# Patient Record
Sex: Male | Born: 2000 | Race: White | Hispanic: No | Smoking: Never smoker
Health system: Southern US, Community
[De-identification: ages and names within clinical notes are randomized; demographics above are authoritative.]

## PROBLEM LIST (undated history)

## (undated) DIAGNOSIS — T7840XA Allergy, unspecified, initial encounter: Secondary | ICD-10-CM

## (undated) DIAGNOSIS — E063 Autoimmune thyroiditis: Secondary | ICD-10-CM

## (undated) DIAGNOSIS — Z8639 Personal history of other endocrine, nutritional and metabolic disease: Secondary | ICD-10-CM

## (undated) DIAGNOSIS — L309 Dermatitis, unspecified: Secondary | ICD-10-CM

## (undated) DIAGNOSIS — Z87898 Personal history of other specified conditions: Secondary | ICD-10-CM

## (undated) HISTORY — PX: MULTIPLE TOOTH EXTRACTIONS: SHX2053

## (undated) HISTORY — DX: Autoimmune thyroiditis: E06.3

## (undated) HISTORY — DX: Allergy, unspecified, initial encounter: T78.40XA

---
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
## 2024-02-25 ENCOUNTER — Other Ambulatory Visit: Payer: Self-pay | Admitting: Nurse Practitioner

## 2024-02-25 ENCOUNTER — Ambulatory Visit
Admission: RE | Admit: 2024-02-25 | Discharge: 2024-02-25 | Disposition: A | Discharge status: Home / Self Care | Source: Ambulatory Visit | Attending: Nurse Practitioner | Admitting: Nurse Practitioner

## 2024-02-25 DIAGNOSIS — Z021 Encounter for pre-employment examination: Secondary | ICD-10-CM

## 2024-10-04 ENCOUNTER — Encounter (HOSPITAL_BASED_OUTPATIENT_CLINIC_OR_DEPARTMENT_OTHER): Payer: Self-pay

## 2024-10-04 ENCOUNTER — Emergency Department (HOSPITAL_BASED_OUTPATIENT_CLINIC_OR_DEPARTMENT_OTHER)
Admission: EM | Admit: 2024-10-04 | Discharge: 2024-10-05 | Disposition: A | Attending: Emergency Medicine | Admitting: Emergency Medicine

## 2024-10-04 ENCOUNTER — Emergency Department (HOSPITAL_BASED_OUTPATIENT_CLINIC_OR_DEPARTMENT_OTHER)

## 2024-10-04 ENCOUNTER — Other Ambulatory Visit: Payer: Self-pay

## 2024-10-04 DIAGNOSIS — L03116 Cellulitis of left lower limb: Secondary | ICD-10-CM | POA: Insufficient documentation

## 2024-10-04 DIAGNOSIS — M7989 Other specified soft tissue disorders: Secondary | ICD-10-CM | POA: Diagnosis present

## 2024-10-04 LAB — CBC WITH DIFFERENTIAL/PLATELET
Abs Immature Granulocytes: 0.03 K/uL (ref 0.00–0.07)
Basophils Absolute: 0 K/uL (ref 0.0–0.1)
Basophils Relative: 0 %
Eosinophils Absolute: 0.3 K/uL (ref 0.0–0.5)
Eosinophils Relative: 3 %
HCT: 40.9 % (ref 39.0–52.0)
Hemoglobin: 13.6 g/dL (ref 13.0–17.0)
Immature Granulocytes: 0 %
Lymphocytes Relative: 16 %
Lymphs Abs: 1.5 K/uL (ref 0.7–4.0)
MCH: 30.3 pg (ref 26.0–34.0)
MCHC: 33.3 g/dL (ref 30.0–36.0)
MCV: 91.1 fL (ref 80.0–100.0)
Monocytes Absolute: 0.5 K/uL (ref 0.1–1.0)
Monocytes Relative: 6 %
Neutro Abs: 6.8 K/uL (ref 1.7–7.7)
Neutrophils Relative %: 75 %
Platelets: 242 K/uL (ref 150–400)
RBC: 4.49 MIL/uL (ref 4.22–5.81)
RDW: 12.5 % (ref 11.5–15.5)
WBC: 9.1 K/uL (ref 4.0–10.5)
nRBC: 0 % (ref 0.0–0.2)

## 2024-10-04 LAB — COMPREHENSIVE METABOLIC PANEL WITH GFR
ALT: 38 U/L (ref 0–44)
AST: 23 U/L (ref 15–41)
Albumin: 4.5 g/dL (ref 3.5–5.0)
Alkaline Phosphatase: 68 U/L (ref 38–126)
Anion gap: 12 (ref 5–15)
BUN: 14 mg/dL (ref 6–20)
CO2: 24 mmol/L (ref 22–32)
Calcium: 9.8 mg/dL (ref 8.9–10.3)
Chloride: 100 mmol/L (ref 98–111)
Creatinine, Ser: 1.21 mg/dL (ref 0.61–1.24)
GFR, Estimated: >60 mL/min
Glucose, Bld: 119 mg/dL — ABNORMAL HIGH (ref 70–99)
Potassium: 3.9 mmol/L (ref 3.5–5.1)
Sodium: 136 mmol/L (ref 135–145)
Total Bilirubin: 0.4 mg/dL (ref 0.0–1.2)
Total Protein: 7.9 g/dL (ref 6.5–8.1)

## 2024-10-04 MED ORDER — DOXYCYCLINE HYCLATE 100 MG PO CAPS: 100.0000 mg | ORAL_CAPSULE | Freq: Two times a day (BID) | ORAL | 0 refills | Status: DC

## 2024-10-04 MED ORDER — CEFTRIAXONE SODIUM 1 G IJ SOLR
1.0000 g | Freq: Once | INTRAMUSCULAR | Status: AC
  Administered 2024-10-05: 1 g via INTRAMUSCULAR
  Filled 2024-10-04: qty 10

## 2024-10-04 MED ORDER — HYDROCODONE-ACETAMINOPHEN 5-325 MG PO TABS
1.0000 | ORAL_TABLET | Freq: Once | ORAL | Status: AC
  Administered 2024-10-04: 1 via ORAL
  Filled 2024-10-04: qty 1

## 2024-10-04 MED ORDER — IBUPROFEN 400 MG PO TABS
600.0000 mg | ORAL_TABLET | Freq: Once | ORAL | Status: AC
  Administered 2024-10-04: 600 mg via ORAL
  Filled 2024-10-04: qty 1

## 2024-10-04 NOTE — ED Triage Notes (Addendum)
 Saw [facility] yesterday dx with [facility]ulitis of [facility] today Knee has become more painful +swelling and redness
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
## 2024-10-06 ENCOUNTER — Inpatient Hospital Stay (HOSPITAL_COMMUNITY)
Admission: EM | Admit: 2024-10-06 | Discharge: 2024-10-09 | DRG: 603 | Disposition: A | Discharge status: Home / Self Care | Attending: Internal Medicine | Admitting: Internal Medicine

## 2024-10-06 ENCOUNTER — Emergency Department (HOSPITAL_COMMUNITY)

## 2024-10-06 ENCOUNTER — Other Ambulatory Visit: Payer: Self-pay

## 2024-10-06 ENCOUNTER — Encounter (HOSPITAL_COMMUNITY): Payer: Self-pay

## 2024-10-06 DIAGNOSIS — Z792 Long term (current) use of antibiotics: Secondary | ICD-10-CM

## 2024-10-06 DIAGNOSIS — L03116 Cellulitis of left lower limb: Secondary | ICD-10-CM | POA: Diagnosis not present

## 2024-10-06 DIAGNOSIS — Z881 Allergy status to other antibiotic agents status: Secondary | ICD-10-CM

## 2024-10-06 DIAGNOSIS — E063 Autoimmune thyroiditis: Secondary | ICD-10-CM | POA: Diagnosis present

## 2024-10-06 DIAGNOSIS — L0390 Cellulitis, unspecified: Secondary | ICD-10-CM | POA: Diagnosis present

## 2024-10-06 DIAGNOSIS — F909 Attention-deficit hyperactivity disorder, unspecified type: Secondary | ICD-10-CM | POA: Insufficient documentation

## 2024-10-06 DIAGNOSIS — M25462 Effusion, left knee: Secondary | ICD-10-CM | POA: Diagnosis present

## 2024-10-06 LAB — CBC WITH DIFFERENTIAL/PLATELET
Abs Immature Granulocytes: 0.03 K/uL (ref 0.00–0.07)
Basophils Absolute: 0 K/uL (ref 0.0–0.1)
Basophils Relative: 0 %
Eosinophils Absolute: 0.3 K/uL (ref 0.0–0.5)
Eosinophils Relative: 5 %
HCT: 39.7 % (ref 39.0–52.0)
Hemoglobin: 13.2 g/dL (ref 13.0–17.0)
Immature Granulocytes: 0 %
Lymphocytes Relative: 28 %
Lymphs Abs: 2 K/uL (ref 0.7–4.0)
MCH: 31.2 pg (ref 26.0–34.0)
MCHC: 33.2 g/dL (ref 30.0–36.0)
MCV: 93.9 fL (ref 80.0–100.0)
Monocytes Absolute: 0.5 K/uL (ref 0.1–1.0)
Monocytes Relative: 7 %
Neutro Abs: 4.2 K/uL (ref 1.7–7.7)
Neutrophils Relative %: 60 %
Platelets: 280 K/uL (ref 150–400)
RBC: 4.23 MIL/uL (ref 4.22–5.81)
RDW: 12.3 % (ref 11.5–15.5)
WBC: 7.1 K/uL (ref 4.0–10.5)
nRBC: 0 % (ref 0.0–0.2)

## 2024-10-06 LAB — COMPREHENSIVE METABOLIC PANEL WITH GFR
ALT: 27 U/L (ref 0–44)
AST: 22 U/L (ref 15–41)
Albumin: 4.2 g/dL (ref 3.5–5.0)
Alkaline Phosphatase: 64 U/L (ref 38–126)
Anion gap: 10 (ref 5–15)
BUN: 12 mg/dL (ref 6–20)
CO2: 27 mmol/L (ref 22–32)
Calcium: 9.5 mg/dL (ref 8.9–10.3)
Chloride: 104 mmol/L (ref 98–111)
Creatinine, Ser: 0.92 mg/dL (ref 0.61–1.24)
GFR, Estimated: >60 mL/min
Glucose, Bld: 110 mg/dL — ABNORMAL HIGH (ref 70–99)
Potassium: 4 mmol/L (ref 3.5–5.1)
Sodium: 140 mmol/L (ref 135–145)
Total Bilirubin: 0.3 mg/dL (ref 0.0–1.2)
Total Protein: 7.6 g/dL (ref 6.5–8.1)

## 2024-10-06 LAB — SEDIMENTATION RATE: Sed Rate: 27 mm/h — ABNORMAL HIGH (ref 0–16)

## 2024-10-06 LAB — I-STAT CG4 LACTIC ACID, ED: Lactic Acid, Venous: 0.5 mmol/L (ref 0.5–1.9)

## 2024-10-06 MED ORDER — SODIUM CHLORIDE 0.9 % IV SOLN
2.0000 g | Freq: Three times a day (TID) | INTRAVENOUS | Status: DC
  Administered 2024-10-07 – 2024-10-09 (×7): 2 g via INTRAVENOUS
  Filled 2024-10-06 (×7): qty 12.5

## 2024-10-06 MED ORDER — OXYCODONE-ACETAMINOPHEN 5-325 MG PO TABS
1.0000 | ORAL_TABLET | Freq: Once | ORAL | Status: AC
  Administered 2024-10-06: 1 via ORAL
  Filled 2024-10-06: qty 1

## 2024-10-06 MED ORDER — VANCOMYCIN HCL 1500 MG/300ML IV SOLN
1500.0000 mg | Freq: Three times a day (TID) | INTRAVENOUS | Status: DC
  Administered 2024-10-07: 1500 mg via INTRAVENOUS
  Filled 2024-10-06 (×2): qty 300

## 2024-10-06 MED ORDER — VANCOMYCIN HCL 2000 MG/400ML IV SOLN: 2000.0000 mg | Freq: Two times a day (BID) | INTRAVENOUS | Status: DC

## 2024-10-06 MED ORDER — VANCOMYCIN HCL 1750 MG/350ML IV SOLN
1750.0000 mg | Freq: Once | INTRAVENOUS | Status: AC
  Administered 2024-10-07: 1750 mg via INTRAVENOUS
  Filled 2024-10-06: qty 350

## 2024-10-06 MED ORDER — VANCOMYCIN HCL IN DEXTROSE 1-5 GM/200ML-% IV SOLN: 1000.0000 mg | Freq: Once | INTRAVENOUS | Status: DC

## 2024-10-06 MED ORDER — KETOROLAC TROMETHAMINE 30 MG/ML IJ SOLN
15.0000 mg | Freq: Once | INTRAMUSCULAR | Status: AC
  Administered 2024-10-06: 15 mg via INTRAVENOUS
  Filled 2024-10-06: qty 1

## 2024-10-06 MED ORDER — SODIUM CHLORIDE 0.9 % IV SOLN
2.0000 g | Freq: Once | INTRAVENOUS | Status: AC
  Administered 2024-10-06: 2 g via INTRAVENOUS
  Filled 2024-10-06: qty 12.5

## 2024-10-06 NOTE — Assessment & Plan Note (Signed)
-  admit per  cellulitis protocol will      Cefepime   vancomycin  given purulent discharge,     plain films showed:   no evidence of air  no evidence of osteomyelitis   no               foreign   objects     Will obtain MRSA screening,       obtain blood cultures  if febrile or septic     further antibiotic adjustment pending above results

## 2024-10-06 NOTE — ED Provider Notes (Signed)
 " [place] EMERGENCY DEPARTMENT AT [facility] Provider Note   CSN: [id number] Arrival date & time: 10/06/24  2035     Patient presents with: Knee Pain   Kenneth Christian is a 24 y.o. male.   Patient to ED with progressively worsening pain, redness and swelling to the left knee that started several days ago. Seen in the ED 10/05/24, given IV Rocephin  and has been taking Doxycycline  and Keflex in the outpatient setting. He went to orthopedic walk-in clinic today and was referred back to the ED with recommendation for inpatient IV abx. No fever. Patient is otherwise healthy and immunocompetent. 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   The history is provided by the patient. No language interpreter was used.  Knee Pain      Prior to Admission medications  Medication Sig Start Date End Date Taking? Authorizing Provider  cephALEXin (KEFLEX) 500 MG capsule Take 500 mg by mouth 4 (four) times daily. 10/04/24  Yes [provider]  doxycycline  (VIBRAMYCIN ) 100 MG capsule Take 1 capsule (100 mg total) by mouth 2 (two) times daily. 10/04/24  Yes Palumbo, April, MD  SUMAtriptan (IMITREX) 100 MG tablet Take 100 mg by mouth every 2 (two) hours as needed. 04/21/23  Yes [provider]  topiramate (TOPAMAX) 100 MG tablet Take 100 mg by mouth daily as needed. 04/21/23  Yes [provider]    Allergies: Zithromax [azithromycin dihydrate]    Review of Systems  Updated Vital Signs BP (!) 147/61   Pulse 65   Temp 98.5 F (36.9 C) (Oral)   Resp 18   Ht 6' 3 (1.905 m)   Wt 90.7 kg   SpO2 100%   BMI 25.00 kg/m   Physical Exam Vitals and nursing note reviewed.  Constitutional:      Appearance: Normal appearance. He is well-developed.  Cardiovascular:     Rate and Rhythm: Normal rate.  Pulmonary:     Effort: Pulmonary effort is normal.  Musculoskeletal:     Cervical back: Normal  range of motion.     Comments: Left knee is swollen anteriorly with marked erythema and warmth to touch. No discrete abscess. There is redness that extends past mid-shaft lower leg bilaterally and to istal lateral thigh. Distal pulses intact.   Skin:    General: Skin is warm and dry.     Findings: Erythema present.  Neurological:     Mental Status: He is alert and oriented to person, place, and time.        (all labs ordered are listed, but only abnormal results are displayed) Labs Reviewed  COMPREHENSIVE METABOLIC PANEL WITH GFR - Abnormal; Notable for the following components:      Result Value   Glucose, Bld 110 (*)    All other components within normal limits  SEDIMENTATION RATE - Abnormal; Notable for the following components:   Sed Rate 27 (*)    All other components within normal limits  CULTURE, BLOOD (ROUTINE X 2)  CULTURE, BLOOD (ROUTINE X 2)  CBC WITH DIFFERENTIAL/PLATELET  C-REACTIVE PROTEIN  I-STAT CG4 LACTIC ACID, ED    EKG: None  Radiology: DG Knee Complete 4 Views Left Result Date: 10/06/2024 EXAM: 4 OR MORE VIEW(S) X-RAY OF THE LEFT KNEE 10/06/2024 09:21:00 PM COMPARISON: 10/04/24 CLINICAL HISTORY: Knee pain, effusion, prepatellar cellulitis FINDINGS: BONES AND JOINTS: No acute fracture. No malalignment. No significant joint effusion. SOFT TISSUES: Unremarkable.  IMPRESSION: 1. No significant abnormality. Electronically signed by: Morgane Naveau MD 10/06/2024 09:23 PM EST RP Workstation: [id number]   DG Knee Complete 4 Views Left Result Date: 10/05/2024 EXAM: 4 VIEW(S) XRAY OF THE LEFT KNEE 10/04/2024 11:57:00 PM COMPARISON: None available. CLINICAL HISTORY: Pain, et jaynie. FINDINGS: BONES AND JOINTS: No acute fracture. No malalignment. No significant joint effusion. SOFT TISSUES: Unremarkable. IMPRESSION: 1. No significant abnormality. Electronically signed by: Morgane Naveau MD 10/05/2024 12:00 AM EST RP Workstation: [id number]     Procedures   Medications  Ordered in the ED  ceFEPIme  (MAXIPIME ) 2 g in sodium chloride  0.9 % 100 mL IVPB (has no administration in time range)  vancomycin  (VANCOREADY) IVPB 1750 mg/350 mL (has no administration in time range)    Clinical Course as of 10/06/24 2313  Thu Oct 06, 2024  2312 Patient with progressively worsening cellulitis of the left knee, now spread to distal lower leg and distal thigh. Sed rate elevated, no leukocytosis. IV Cefepime  and Vanc ordered. Cultures pending.   Discussed with dr. Doutova, TRH, who accepts for admission. [SU]    Clinical Course User Index [SU] Odell Balls, PA-C                                 Medical Decision Making Risk Prescription drug management. Decision regarding hospitalization.        Final diagnoses:  Cellulitis of left lower extremity    ED Discharge Orders     None          Odell Balls, DEVONNA 10/06/24 2313    Patt Alm Macho, MD 10/11/24 1457  "

## 2024-10-06 NOTE — H&P (Signed)
 "    Kenneth Christian FMW:[id number] DOB: 2001/06/18 DOA: 10/06/2024     PCP: Nicoletta Bonni Lien     Patient arrived to ER on 10/06/24 at 2035 Referred by Attending Patt Alm Macho, MD   Patient coming from:    home      Chief Complaint:   Chief Complaint  Patient presents with   Knee Pain    HPI: Kenneth Christian is a 24 y.o. male with medical history significant of ADHD as a child, migraines    Presented with   left knee pain and swelling Patient presents with left knee redness since Sunday was seen in urgent care started on Bactrim only to 1 day of that then [facility] was seen in the emergency department yesterday given a dose of Rocephin  IM Given a prescription of doxycycline  to add to the Keflex Patient seen by [facility] today redness and swelling increased and was sent back to emergency department for failure of outpatient antibiotics. Has had fever Plain images nonacute no foreign body or gas Patient states that he woke up Sunday night feeling pain in his left knee at first there was a small red spot despite going to urgent care and being started on antibiotics the pain and the redness has progressed.  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  No break in the skin noted no insect bites Patient never had any injury to that knee before. no history of gout  Denies significant ETOH intake   Does not smoke     While in ER: Clinical Course as of 10/06/24 2318  Thu Oct 06, 2024  2312 Patient with progressively worsening cellulitis of the left knee, now spread to distal lower leg and distal thigh. Sed rate elevated, no leukocytosis. IV Cefepime  and Vanc ordered. Cultures pending.   Discussed with dr. Emmalina Espericueta, TRH, who accepts for admission. [SU]    Clinical Course User Index [SU] Odell Balls, PA-C    Blood  cultures ordered  Patient has started on cefepime  and vanc    Lab Orders         Blood culture (routine x 2)         CBC with Differential         Comprehensive metabolic panel         Sedimentation rate         C-reactive protein         I-Stat CG4 Lactic Acid    Left knee non acute Following Medications were ordered in ER: Medications  ceFEPIme  (MAXIPIME ) 2 g in sodium chloride  0.9 % 100 mL IVPB (has no administration in time range)  vancomycin  (VANCOREADY) IVPB 1750 mg/350 mL (has no administration in time range)    _______________________________________________________ ER Provider Called:     orthopedics  Dr.Norris They Recommend admit to medicine    No surgical indication at this time , please re consult as needed    ED Triage Vitals  Encounter Vitals Group     BP 10/06/24 2042 (!) 147/61     Girls Systolic BP Percentile --      Girls Diastolic BP Percentile --      Boys Systolic BP Percentile --      Boys Diastolic BP Percentile --      Pulse Rate 10/06/24 2042 65  Resp 10/06/24 2042 18     Temp 10/06/24 2042 98.5 F (36.9 C)     Temp Source 10/06/24 2042 Oral     SpO2 10/06/24 2042 100 %     Weight 10/06/24 2053 200 lb (90.7 kg)     Height 10/06/24 2053 6' 3 (1.905 m)     Head Circumference --      Peak Flow --      Pain Score 10/06/24 2053 6     Pain Loc --      Pain Education --      Exclude from Growth Chart --   UFJK(75)@     _________________________________________ Significant initial  Findings: Abnormal Labs Reviewed  COMPREHENSIVE METABOLIC PANEL WITH GFR - Abnormal; Notable for the following components:      Result Value   Glucose, Bld 110 (*)    All other components within normal limits  SEDIMENTATION RATE - Abnormal; Notable for the following components:   Sed Rate 27 (*)    All other components within normal limits       The recent clinical data is shown below. Vitals:   10/06/24 2042 10/06/24 2053  BP: (!) 147/61   Pulse: 65    Resp: 18   Temp: 98.5 F (36.9 C)   TempSrc: Oral   SpO2: 100%   Weight:  90.7 kg  Height:  6' 3 (1.905 m)   WBC     Component Value Date/Time   WBC 7.1 10/06/2024 2113   LYMPHSABS 2.0 10/06/2024 2113   MONOABS 0.5 10/06/2024 2113   EOSABS 0.3 10/06/2024 2113   BASOSABS 0.0 10/06/2024 2113     ABX started cefepime  and vanc  __________________________________________________________ Recent Labs  Lab 10/04/24 2157 10/06/24 2113  NA 136 140  K 3.9 4.0  CO2 24 27  GLUCOSE 119* 110*  BUN 14 12  CREATININE 1.21 0.92  CALCIUM 9.8 9.5    Cr   stable,   Lab Results  Component Value Date   CREATININE 0.92 10/06/2024   CREATININE 1.21 10/04/2024   CREATININE 0.67 12/13/2014   CREATININE 0.61 12/13/2014    Recent Labs  Lab 10/04/24 2157 10/06/24 2113  AST 23 22  ALT 38 27  ALKPHOS 68 64  BILITOT 0.4 0.3  PROT 7.9 7.6  ALBUMIN 4.5 4.2   Lab Results  Component Value Date   CALCIUM 9.5 10/06/2024        Plt: Lab Results  Component Value Date   PLT 280 10/06/2024      Recent Labs  Lab 10/04/24 2157 10/06/24 2113  WBC 9.1 7.1  NEUTROABS 6.8 4.2  HGB 13.6 13.2  HCT 40.9 39.7  MCV 91.1 93.9  PLT 242 280    HG/HCT stable,      Component Value Date/Time   HGB 13.2 10/06/2024 2113   HCT 39.7 10/06/2024 2113   MCV 93.9 10/06/2024 2113     _______________________________________________ Hospitalist was called for admission for   Cellulitis of left lower extremity     The following Work up has been ordered so far:  Orders Placed This Encounter  Procedures   Blood culture (routine x 2)   DG Knee Complete 4 Views Left   CBC with Differential   Comprehensive metabolic panel   Sedimentation rate   C-reactive protein   Consult to hospitalist   I-Stat CG4 Lactic Acid     OTHER Significant initial  Findings:  labs showing:  Cultures: No results found for: SDES, SPECREQUEST, CULT, REPTSTATUS   Radiological Exams on  Admission: DG Knee Complete 4 Views Left Result Date: 10/06/2024 EXAM: 4 OR MORE VIEW(S) X-RAY OF THE LEFT KNEE 10/06/2024 09:21:00 PM COMPARISON: 10/04/24 CLINICAL HISTORY: Knee pain, effusion, prepatellar cellulitis FINDINGS: BONES AND JOINTS: No acute fracture. No malalignment. No significant joint effusion. SOFT TISSUES: Unremarkable. IMPRESSION: 1. No significant abnormality. Electronically signed by: Morgane Naveau MD 10/06/2024 09:23 PM EST RP Workstation: [id number]   DG Knee Complete 4 Views Left Result Date: 10/05/2024 EXAM: 4 VIEW(S) XRAY OF THE LEFT KNEE 10/04/2024 11:57:00 PM COMPARISON: None available. CLINICAL HISTORY: Pain, et jaynie. FINDINGS: BONES AND JOINTS: No acute fracture. No malalignment. No significant joint effusion. SOFT TISSUES: Unremarkable. IMPRESSION: 1. No significant abnormality. Electronically signed by: Morgane Naveau MD 10/05/2024 12:00 AM EST RP Workstation: [id number]   _______________________________________________________________________________________________________ Latest  Blood pressure (!) 147/61, pulse 65, temperature 98.5 F (36.9 C), temperature source Oral, resp. rate 18, height 6' 3 (1.905 m), weight 90.7 kg, SpO2 100%.   Vitals  labs and radiology finding personally reviewed  Review of Systems:    Pertinent positives include:  Fevers, chills, fatigue, left leg pain and swelling  Constitutional:  No weight loss, night sweats,  weight loss  HEENT:  No headaches, Difficulty swallowing,Tooth/dental problems,Sore throat,  No sneezing, itching, ear ache, nasal congestion, post nasal drip,  Cardio-vascular:  No chest pain, Orthopnea, PND, anasarca, dizziness, palpitations.no Bilateral lower extremity swelling  GI:  No heartburn, indigestion, abdominal pain, nausea, vomiting, diarrhea, change in bowel habits, loss of appetite, melena, blood in stool, hematemesis Resp:  no shortness of breath at rest. No dyspnea on exertion, No excess mucus, no  productive cough, No non-productive cough, No coughing up of blood.No change in color of mucus.No wheezing. Skin:  no rash or lesions. No jaundice GU:  no dysuria, change in color of urine, no urgency or frequency. No straining to urinate.  No flank pain.  Musculoskeletal:  No joint pain or no joint swelling. No decreased range of motion. No back pain.  Psych:  No change in mood or affect. No depression or anxiety. No memory loss.  Neuro: no localizing neurological complaints, no tingling, no weakness, no double vision, no gait abnormality, no slurred speech, no confusion  All systems reviewed and apart from HOPI all are negative _______________________________________________________________________________________________ Past Medical History:   Past Medical History:  Diagnosis Date   Allergy    Eczema    Hashimoto's thyroiditis    no current med.   History of sexual precocity       Past Surgical History:  Procedure Laterality Date   MULTIPLE TOOTH EXTRACTIONS     SUPPRELIN  IMPLANT  12/02/2010   SUPPRELIN  IMPLANT  06/17/2012   Procedure: SUPPRELIN  IMPLANT;  Surgeon: CHRISTELLA. Julietta Millman, MD;  Location: [facility];  Service: Pediatrics;  Laterality: Left;  removal and reinsertion of supprelin  implant   SUPPRELIN  IMPLANT Left 06/08/2014   Procedure: SUPPRELIN  IMPLANT REMOVAL FROM LEFT UPPER EXTREMITY;  Surgeon: CHRISTELLA. Julietta Millman, MD;  Location: [facility];  Service: Pediatrics;  Laterality: Left;    Social History:  Ambulatory   independently      reports that he has never smoked. He has never used smokeless tobacco. He reports that he does not drink alcohol and does not use drugs.     Family History:   Family History  Problem Relation Age of Onset   Early puberty Brother    Heart  disease Maternal Grandfather    Hypertension Paternal Grandmother     ______________________________________________________________________________________________ Allergies: Allergies[1]   Prior to Admission medications  Medication Sig Start Date End Date Taking? Authorizing Provider  cephALEXin (KEFLEX) 500 MG capsule Take 500 mg by mouth 4 (four) times daily. 10/04/24  Yes [provider]  doxycycline  (VIBRAMYCIN ) 100 MG capsule Take 1 capsule (100 mg total) by mouth 2 (two) times daily. 10/04/24  Yes Palumbo, April, MD  SUMAtriptan (IMITREX) 100 MG tablet Take 100 mg by mouth every 2 (two) hours as needed. 04/21/23  Yes [provider]  topiramate (TOPAMAX) 100 MG tablet Take 100 mg by mouth daily as needed. 04/21/23  Yes [provider]    ___________________________________________________________________________________________________ Physical Exam:    10/06/2024    8:53 PM 10/06/2024    8:42 PM 10/05/2024   12:14 AM  Vitals with BMI  Height 6' 3    Weight 200 lbs    BMI 25    Systolic  147   Diastolic  61   Pulse  65 67     1. General:  in No  Acute distress   Well  -appearing 2. Psychological: Alert and   Oriented 3. Head/ENT:  Dry Mucous Membranes                          Head Non traumatic, neck supple                          Normal  Dentition 4. SKIN: normal  Skin turgor,  Skin clean Dry redness of left knee        5. Heart: Regular rate and rhythm no  Murmur, no Rub or gallop 6. Lungs:  , no wheezes or crackles   7. Abdomen: Soft,  non-tender, Non distended  bowel sounds present 8. Lower extremities: no clubbing, cyanosis, no  edema 9. Neurologically Grossly intact, moving all 4 extremities equally   10. MSK: Normal range of motion    Chart has been reviewed  ______________________________________________________________________________________________  Assessment/Plan 23 y.o. male with medical history significant of ADHD   Admitted for   Cellulitis of left lower extremity     Present  on Admission:  Cellulitis of knee, left  Cellulitis     Cellulitis of knee, left -admit per  cellulitis protocol will      Cefepime   vancomycin  given purulent discharge,     plain films showed:   no evidence of air  no evidence of osteomyelitis   no               foreign   objects     Will obtain MRSA screening,       obtain blood cultures  if febrile or septic     further antibiotic adjustment pending above results    Other plan as per orders.  DVT prophylaxis:  SCD      Code Status:    Code Status: Not on file FULL CODE  as per patient   I had personally discussed CODE STATUS with patient and family*  ACP   none    Family Communication:   Family not at  Bedside    Diet regular   Disposition Plan:     To home once workup is complete and patient is stable   Following barriers for discharge:  Pain controlled with PO medications                               Afebrile,   able to transition to PO antibiotics                                   Consult Orders  (From admission, onward)           Start     Ordered   10/06/24 2228  Consult to hospitalist  Once       Provider:  (Not yet assigned)  Question Answer Comment  Place call to: [facility]   Reason for Consult Admit      10/06/24 2227                               Consults called:    NONE   Admission status:  ED Disposition     ED Disposition  Admit   Condition  --   Comment  The patient appears reasonably stabilized for admission considering the current resources, flow, and capabilities available in the ED at this time, and I doubt any other [facility] requiring further screening and/or treatment in the ED prior to admission is  present.               inpatient     I Expect 2 midnight stay secondary to severity of patient's current illness need for inpatient interventions justified by the following:     Severe lab/radiological/exam  abnormalities including:    Cellulitis of left lower extremity     That are currently affecting medical management.   I expect  patient to be hospitalized for 2 midnights requiring inpatient medical care.  Patient is at high risk for adverse outcome (such as loss of life or disability) if not treated.  Indication for inpatient stay as follows:   severe pain requiring acute inpatient management,   Need for IV antibiotics, IV fluids,  IV pain medications,     Level of care      medical floor     Rosabell Geyer 10/07/2024, 12:50 AM    [facility]     after 2 AM please page floor coverage   If 7AM-7PM, please contact the day team taking care of the patient using [email or web address]        [1]  Allergies Allergen Reactions   Zithromax [Azithromycin Dihydrate] Hives   "

## 2024-10-06 NOTE — ED Triage Notes (Addendum)
 Encouraged to ED from [facility] for probable admission r/t failed outpatient antibiotics.   Presented to [facility] ED several days ago and was given IM Ceftriaxone . Followed up with [facility] and started on Keflex / Doxycycline .   Since then swelling to left knee and redness has grown significantly.

## 2024-10-06 NOTE — ED Provider Triage Note (Signed)
 Emergency Medicine Provider Triage Evaluation Note  Kenneth Christian , a 24 y.o. male  was evaluated in triage.  Pt complains of knee pain.  Patient reportedly was sent in from [facility] for recommendations of admission for failure of outpatient antibiotics.  He was seen in the emergency department a few days ago, diagnosed with cellulitis of the left lower leg with presentation primarily to the anterior surface of the left knee given IM Rocephin .  Patient seen by [facility] and was started on Keflex and doxycycline .  Since he had antibiotic change, the area of erythema, warmth, and swelling has worsened and progressed with extension towards below the calf of the left leg.  Review of Systems  Positive: As above Negative: As above  Physical Exam  BP (!) 147/61   Pulse 65   Temp 98.5 F (36.9 C) (Oral)   Resp 18   Ht 6' 3 (1.905 m)   Wt 90.7 kg   SpO2 100%   BMI 25.00 kg/m  Gen:   Awake, no distress   Resp:  Normal effort MSK:   Range of motion of the left knee limited due to pain at the extremes.  The knee itself is slightly swollen, erythematous, and warm to the touch. Other:    Medical Decision Making  Medically screening exam initiated at 9:04 PM.  Appropriate orders placed.  Kenneth Christian was informed that the remainder of the evaluation will be completed by another provider, this initial triage assessment does not replace that evaluation, and the importance of remaining in the ED until their evaluation is complete.     Novia Lansberry A, PA-C 10/06/24 2105

## 2024-10-06 NOTE — Subjective & Objective (Addendum)
 Patient presents with left knee redness since Sunday was seen in urgent care started on Bactrim only to 1 day of that then [facility] was seen in the emergency department yesterday given a dose of Rocephin  IM Given a prescription of doxycycline  to add to the Keflex Patient seen by [facility] today redness and swelling increased and was sent back to emergency department for failure of outpatient antibiotics. Has had fever Plain images nonacute no foreign body or gas

## 2024-10-06 NOTE — Progress Notes (Signed)
 Pharmacy Antibiotic Note  Kenneth Christian is a 24 y.o. male admitted on 10/06/2024 with left knee cellulitis.  Pharmacy has been consulted for Vancomycin  and Cefepime  dosing.  Plan: Vancomycin  1500 mg IV Q 8 hrs. Goal AUC 400-550.  Expected AUC: 541.6  SCr used: 0.92 Cefepime  2g IV q8h Follow renal function Check Vancomycin  level as indicated F/u culture results & sensitivities  Height: 6' 3 (190.5 cm) Weight: 90.7 kg (200 lb) IBW/kg (Calculated) : 84.5  Temp (24hrs), Avg:98.5 F (36.9 C), Min:98.4 F (36.9 C), Max:98.5 F (36.9 C)  Recent Labs  Lab 10/04/24 2157 10/06/24 2113 10/06/24 2315  WBC 9.1 7.1  --   CREATININE 1.21 0.92  --   LATICACIDVEN  --   --  0.5    Estimated Creatinine Clearance: 148 mL/min (by C-G formula based on SCr of 0.92 mg/dL).    Allergies[1]  Antimicrobials this admission: 1/15 Cefepime  >>   1/16 Vancomycin  >>    Dose adjustments this admission:    Microbiology results: 1/15 BCx:   1/15 MRSA PCR:    Thank you for allowing pharmacy to be a part of this patients care.  Arvin Gauss, PharmD 10/06/2024 11:39 PM     [1]  Allergies Allergen Reactions   Zithromax [Azithromycin Dihydrate] Hives

## 2024-10-07 DIAGNOSIS — L02416 Cutaneous abscess of left lower limb: Secondary | ICD-10-CM | POA: Diagnosis not present

## 2024-10-07 DIAGNOSIS — L03116 Cellulitis of left lower limb: Secondary | ICD-10-CM | POA: Diagnosis not present

## 2024-10-07 LAB — COMPREHENSIVE METABOLIC PANEL WITH GFR
ALT: 18 U/L (ref 0–44)
AST: 16 U/L (ref 15–41)
Albumin: 3.3 g/dL — ABNORMAL LOW (ref 3.5–5.0)
Alkaline Phosphatase: 50 U/L (ref 38–126)
Anion gap: 9 (ref 5–15)
BUN: 13 mg/dL (ref 6–20)
CO2: 22 mmol/L (ref 22–32)
Calcium: 7.8 mg/dL — ABNORMAL LOW (ref 8.9–10.3)
Chloride: 111 mmol/L (ref 98–111)
Creatinine, Ser: 0.86 mg/dL (ref 0.61–1.24)
GFR, Estimated: >60 mL/min
Glucose, Bld: 80 mg/dL (ref 70–99)
Potassium: 3.8 mmol/L (ref 3.5–5.1)
Sodium: 142 mmol/L (ref 135–145)
Total Bilirubin: <0.2 mg/dL (ref 0.0–1.2)
Total Protein: 5.9 g/dL — ABNORMAL LOW (ref 6.5–8.1)

## 2024-10-07 LAB — CBC
HCT: 38.3 % — ABNORMAL LOW (ref 39.0–52.0)
Hemoglobin: 11.9 g/dL — ABNORMAL LOW (ref 13.0–17.0)
MCH: 29.9 pg (ref 26.0–34.0)
MCHC: 31.1 g/dL (ref 30.0–36.0)
MCV: 96.2 fL (ref 80.0–100.0)
Platelets: 246 K/uL (ref 150–400)
RBC: 3.98 MIL/uL — ABNORMAL LOW (ref 4.22–5.81)
RDW: 12.5 % (ref 11.5–15.5)
WBC: 6.1 K/uL (ref 4.0–10.5)
nRBC: 0 % (ref 0.0–0.2)

## 2024-10-07 LAB — HIV ANTIBODY (ROUTINE TESTING W REFLEX): HIV Screen 4th Generation wRfx: NONREACTIVE

## 2024-10-07 LAB — MRSA NEXT GEN BY PCR, NASAL: MRSA by PCR Next Gen: NOT DETECTED

## 2024-10-07 LAB — PHOSPHORUS: Phosphorus: 3.8 mg/dL (ref 2.5–4.6)

## 2024-10-07 LAB — MAGNESIUM: Magnesium: 1.9 mg/dL (ref 1.7–2.4)

## 2024-10-07 LAB — C-REACTIVE PROTEIN: CRP: 10 mg/dL — ABNORMAL HIGH

## 2024-10-07 MED ORDER — ACETAMINOPHEN 325 MG PO TABS: 650.0000 mg | ORAL_TABLET | Freq: Four times a day (QID) | ORAL | Status: DC | PRN

## 2024-10-07 MED ORDER — HYDROCODONE-ACETAMINOPHEN 5-325 MG PO TABS
1.0000 | ORAL_TABLET | ORAL | Status: DC | PRN
  Administered 2024-10-07 (×2): 2 via ORAL
  Administered 2024-10-08 (×2): 1 via ORAL
  Filled 2024-10-07 (×2): qty 2
  Filled 2024-10-07: qty 1
  Filled 2024-10-07: qty 2

## 2024-10-07 MED ORDER — HEPARIN SODIUM (PORCINE) 5000 UNIT/ML IJ SOLN
5000.0000 [IU] | Freq: Three times a day (TID) | INTRAMUSCULAR | Status: DC
  Administered 2024-10-07 – 2024-10-09 (×5): 5000 [IU] via SUBCUTANEOUS
  Filled 2024-10-07 (×5): qty 1

## 2024-10-07 MED ORDER — VANCOMYCIN HCL 1250 MG/250ML IV SOLN
1250.0000 mg | Freq: Three times a day (TID) | INTRAVENOUS | Status: DC
  Administered 2024-10-07 – 2024-10-09 (×5): 1250 mg via INTRAVENOUS
  Filled 2024-10-07 (×8): qty 250

## 2024-10-07 MED ORDER — ACETAMINOPHEN 650 MG RE SUPP: 650.0000 mg | Freq: Four times a day (QID) | RECTAL | Status: DC | PRN

## 2024-10-07 MED ORDER — HEPARIN SODIUM (PORCINE) 5000 UNIT/ML IJ SOLN: 5000.0000 [IU] | Freq: Three times a day (TID) | INTRAMUSCULAR | Status: DC

## 2024-10-07 MED ORDER — SODIUM CHLORIDE 0.9 % IV SOLN: INTRAVENOUS | Status: AC

## 2024-10-07 MED ORDER — ONDANSETRON HCL 4 MG/2ML IJ SOLN: 4.0000 mg | Freq: Four times a day (QID) | INTRAMUSCULAR | Status: DC | PRN

## 2024-10-07 MED ORDER — FENTANYL CITRATE (PF) 50 MCG/ML IJ SOSY: 12.5000 ug | PREFILLED_SYRINGE | INTRAMUSCULAR | Status: DC | PRN

## 2024-10-07 MED ORDER — ONDANSETRON HCL 4 MG PO TABS: 4.0000 mg | ORAL_TABLET | Freq: Four times a day (QID) | ORAL | Status: DC | PRN

## 2024-10-07 NOTE — Progress Notes (Signed)
 Orthopedic Tech Progress Note Patient Details:  Kenneth Christian 12-17-2000 [id number]  Ortho Devices Type of Ortho Device: Knee Immobilizer, Crutches Ortho Device/Splint Location: LLE Ortho Device/Splint Interventions: Application   Post Interventions Patient Tolerated: Well  [name] [name] 10/07/2024, 1:15 PM

## 2024-10-07 NOTE — Progress Notes (Signed)
 " PROGRESS NOTE    Kenneth Christian  FMW:[id number] DOB: [birth year] DOA: 10/06/2024 [facility]CP: [facility]  Outpatient Specialists:     Brief Narrative:  As per H&P done on presentation: Kenneth Christian is a 24 y.o. male with medical history significant of ADHD as a child, migraines     Presented with   left knee pain and swelling Patient presents with left knee redness since Sunday was seen in urgent care started on Bactrim only to 1 day of that then [facility] was seen in the emergency department yesterday given a dose of Rocephin  IM Given a prescription of doxycycline  to add to the Keflex Patient seen by [facility] today redness and swelling increased and was sent back to emergency department for failure of outpatient antibiotics. Has had fever Plain images nonacute no foreign body or gas Patient states that he woke up Sunday night feeling pain in his left knee at first there was a small red spot despite going to urgent care and being started on antibiotics the pain and the redness has progressed.  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  No break in the skin noted no insect bites Patient never had any injury to that knee before. no history of gout  10/07/2024: Patient is on IV vancomycin  and cefepime .  Patient seen alongside patient's mother, further and girlfriend.  Cellulitis is improving.  No fever or chills.  Continue antibiotics for now.   Assessment & Plan:   Principal Problem:   Cellulitis Active Problems:   Cellulitis of knee, left   Cellulitis of knee, left - Continue IV vancomycin  and cefepime . -Cellulitis is slowly improving. - Follow cultures.  DVT prophylaxis: Subcutaneous heparin  Code Status: Full code. Family Communication: Parents and girlfriend. Disposition Plan: Likely discharge back home  eventually.   Consultants:  None.  Procedures:  None.  Antimicrobials:  IV vancomycin . IV cefepime .   Subjective: No new complaints.  Objective: Vitals:   10/07/24 1230 10/07/24 1237 10/07/24 1400 10/07/24 1611  BP:   (!) 128/52 (!) 140/69  Pulse: 68  62 68  Resp:   17   Temp:  97.9 F (36.6 C) 98.4 F (36.9 C) 98.3 F (36.8 C)  TempSrc:  Oral Oral   SpO2: 100%  100% 99%  Weight:      Height:        Intake/Output Summary (Last 24 hours) at 10/07/2024 1644 Last data filed at 10/07/2024 0234 Gross per 24 hour  Intake 445.82 ml  Output --  Net 445.82 ml   Filed Weights   10/06/24 2053  Weight: 90.7 kg    Examination:  General exam: Appears calm and comfortable  Respiratory system: Clear to auscultation. Respiratory effort normal. Cardiovascular system: S1 & S2 heard. Gastrointestinal system: Abdomen is soft and nontender. \ Central nervous system: Awake and alert.. Extremities:  (Picture taken today 10/07/2024)    (Taken on 10/06/2024)  Data Reviewed: I have personally reviewed following labs and imaging studies  CBC: Recent Labs  Lab 10/04/24 2157 10/06/24 2113 10/07/24 0501  WBC 9.1 7.1 6.1  NEUTROABS 6.8 4.2  --   HGB 13.6 13.2 11.9*  HCT 40.9 39.7 38.3*  MCV 91.1 93.9 96.2  PLT 242 280 246   Basic Metabolic Panel: Recent Labs  Lab 10/04/24 2157 10/06/24 2113 10/07/24 0501  NA 136  140 142  K 3.9 4.0 3.8  CL 100 104 111  CO2 24 27 22   GLUCOSE 119* 110* 80  BUN 14 12 13   CREATININE 1.21 0.92 0.86  CALCIUM 9.8 9.5 7.8*  MG  --   --  1.9  PHOS  --   --  3.8   GFR: Estimated Creatinine Clearance: 158.3 mL/min (by C-G formula based on SCr of 0.86 mg/dL). Liver Function Tests: Recent Labs  Lab 10/04/24 2157 10/06/24 2113 10/07/24 0501  AST 23 22 16   ALT 38 27 18  ALKPHOS 68 64 50  BILITOT 0.4 0.3 <0.2  PROT 7.9 7.6 5.9*  ALBUMIN 4.5 4.2 3.3*   No results for input(s): LIPASE, AMYLASE in the last 168 hours. No results  for input(s): AMMONIA in the last 168 hours. Coagulation Profile: No results for input(s): INR, PROTIME in the last 168 hours. Cardiac Enzymes: No results for input(s): CKTOTAL, CKMB, CKMBINDEX, TROPONINI in the last 168 hours. BNP (last 3 results) No results for input(s): PROBNP in the last 8760 hours. HbA1C: No results for input(s): HGBA1C in the last 72 hours. CBG: No results for input(s): GLUCAP in the last 168 hours. Lipid Profile: No results for input(s): CHOL, HDL, LDLCALC, TRIG, CHOLHDL, LDLDIRECT in the last 72 hours. Thyroid Function Tests: No results for input(s): TSH, T4TOTAL, FREET4, T3FREE, THYROIDAB in the last 72 hours. Anemia Panel: No results for input(s): VITAMINB12, FOLATE, FERRITIN, TIBC, IRON, RETICCTPCT in the last 72 hours. Urine analysis: No results found for: COLORURINE, APPEARANCEUR, LABSPEC, PHURINE, GLUCOSEU, HGBUR, BILIRUBINUR, KETONESUR, PROTEINUR, UROBILINOGEN, NITRITE, LEUKOCYTESUR Sepsis Labs: @LABRCNTIP (procalcitonin:4,lacticidven:4)  ) Recent Results (from the past 240 hours)  Blood culture (routine x 2)     Status: None (Preliminary result)   Collection Time: 10/06/24 11:10 PM   Specimen: BLOOD  Result Value Ref Range Status   Specimen Description   Final    BLOOD RIGHT ANTECUBITAL Performed at [facility], [address] [place], [place] [address]    Special Requests   Final    BOTTLES DRAWN AEROBIC AND ANAEROBIC Blood Culture adequate volume Performed at [facility], [address], [place], [place] [address]    Culture   Final    NO GROWTH < 12 HOURS Performed at [facility], [address] [place], [place] [address]    Report Status PENDING  Incomplete  Blood culture (routine x 2)     Status: None (Preliminary result)   Collection Time: 10/06/24 11:18 PM   Specimen: BLOOD RIGHT HAND  Result Value Ref Range  Status   Specimen Description   Final    BLOOD RIGHT HAND Performed at [facility], [address] [place], [place] [address]    Special Requests   Final    BOTTLES DRAWN AEROBIC AND ANAEROBIC Blood Culture results may not be optimal due to an inadequate volume of blood received in culture bottles Performed at [facility], [address] [place], [place] [address]    Culture   Final    NO GROWTH < 12 HOURS Performed at [facility], [address], [place], [place] [address]    Report Status PENDING  Incomplete  MRSA Next Gen by PCR, Nasal     Status: None   Collection Time: 10/06/24 11:40 PM   Specimen: Nasal Mucosa; Nasal Swab  Result Value Ref Range Status   MRSA by PCR Next Gen NOT DETECTED NOT DETECTED Final  Comment: (NOTE) The GeneXpert MRSA Assay (FDA approved for NASAL specimens only), is one component of a comprehensive MRSA colonization surveillance program. It is not intended to diagnose MRSA infection nor to guide or monitor treatment for MRSA infections. Test performance is not FDA approved in patients less than 74 years old. Performed at [facility], [address] [place], [place] [address]          Radiology Studies: DG Knee Complete 4 Views Left Result Date: 10/06/2024 EXAM: 4 OR MORE VIEW(S) X-RAY OF THE LEFT KNEE 10/06/2024 09:21:00 PM COMPARISON: 10/04/24 CLINICAL HISTORY: Knee pain, effusion, prepatellar cellulitis FINDINGS: BONES AND JOINTS: No acute fracture. No malalignment. No significant joint effusion. SOFT TISSUES: Unremarkable. IMPRESSION: 1. No significant abnormality. Electronically signed by: Morgane Naveau MD 10/06/2024 09:23 PM EST RP Workstation: [id number]        Scheduled Meds:  heparin   5,000 Units Subcutaneous Q8H   Continuous Infusions:  ceFEPime  (MAXIPIME ) IV 2 g (10/07/24 1632)   vancomycin        LOS: 1 day    Time spent: 35 minutes.    Leatrice Chapel, MD  [facility] 7PM-7AM contact night coverage as above    "

## 2024-10-07 NOTE — Progress Notes (Signed)
 Orthopedics Courtesy Note  Subjective: Patient known to our office with a one week history of left leg cellulitis. Patient is in the firefighter academy and reported the atraumatic onset of left prepatellar swelling, erythema and pain. Seen in our urgent care yesterday and referred to ED due to fevers and chills and streaking in the leg concerning for a failure of outpatient management with po Keflex and po Doxy.   Objective:  Vitals:   10/07/24 1045 10/07/24 1100  BP:  124/66  Pulse: 64 69  Resp:  18  Temp:    SpO2: 100% 100%    General: Awake and alert  Musculoskeletal: Left knee with no pain with PROM and AROM in a neutral arc. Redness and swelling noted in the prepatellar tissues. No effusion. Neg Homan's. Streaking down toward the ankle Neurovascularly intact  Lab Results  Component Value Date   WBC 6.1 10/07/2024   HGB 11.9 (L) 10/07/2024   HCT 38.3 (L) 10/07/2024   MCV 96.2 10/07/2024   PLT 246 10/07/2024       Component Value Date/Time   NA 142 10/07/2024 0501   K 3.8 10/07/2024 0501   CL 111 10/07/2024 0501   CO2 22 10/07/2024 0501   GLUCOSE 80 10/07/2024 0501   BUN 13 10/07/2024 0501   CREATININE 0.86 10/07/2024 0501   CREATININE 0.67 12/13/2014 1719   CREATININE 0.61 12/13/2014 1719   CALCIUM 7.8 (L) 10/07/2024 0501   GFRNONAA >60 10/07/2024 0501    No results found for: INR, PROTIME  Assessment/Plan: Left prepatellar cellulitis and likely septic bursitis with no sign of septic knee Agree with admission for IV abx per internal medicine and ID. I would immobilize the knee as well to rest the soft tissues.  Please call if you need anything from us   Elspeth R. Kay, MD 10/07/2024 12:37 PM [phone]

## 2024-10-07 NOTE — ED Notes (Signed)
 RN see that previous heparin  injection was marked as contraindicated and RN has messaged MD Ogbata to see if that is correct so next dose is not given.

## 2024-10-07 NOTE — Progress Notes (Signed)
 Pharmacy Antibiotic Note  Kenneth Christian is a 24 y.o. male admitted on 10/06/2024 with left knee cellulitis.  Pharmacy has been consulted for Vancomycin  and Cefepime  dosing.  Plan: Change vancomycin  to 1250mg  IV IV Q 8 hrs. Goal AUC 400-550.  Expected AUC: 422   SCr used: 0.86 Cefepime  2g IV q8h Follow renal function Check Vancomycin  level as indicated F/u culture results & sensitivities  Height: 6' 3 (190.5 cm) Weight: 90.7 kg (200 lb) IBW/kg (Calculated) : 84.5  Temp (24hrs), Avg:98.2 F (36.8 C), Min:97.9 F (36.6 C), Max:98.5 F (36.9 C)  Recent Labs  Lab 10/04/24 2157 10/06/24 2113 10/06/24 2315 10/07/24 0501  WBC 9.1 7.1  --  6.1  CREATININE 1.21 0.92  --  0.86  LATICACIDVEN  --   --  0.5  --     Estimated Creatinine Clearance: 158.3 mL/min (by C-G formula based on SCr of 0.86 mg/dL).    Allergies[1]  Antimicrobials this admission: 1/15 Cefepime  >>   1/16 Vancomycin  >>    Dose adjustments this admission:    Microbiology results: 1/15 BCx:   1/15 MRSA PCR:    Thank you for allowing pharmacy to be a part of this patients care.  Eva CHRISTELLA Allis, PharmD, BCPS Secure Chat if ?s 10/07/2024 2:35 PM       [1]  Allergies Allergen Reactions   Zithromax [Azithromycin Dihydrate] Hives

## 2024-10-08 ENCOUNTER — Inpatient Hospital Stay (HOSPITAL_COMMUNITY)

## 2024-10-08 DIAGNOSIS — L03116 Cellulitis of left lower limb: Secondary | ICD-10-CM | POA: Diagnosis not present

## 2024-10-08 NOTE — Progress Notes (Signed)
 " PROGRESS NOTE    Kenneth Christian  FMW:[id number] DOB: 12/01/2000 DOA: 10/06/2024 [facility]: [facility]  Outpatient Specialists:     Brief Narrative:  As per H&P done on presentation: Kenneth Christian is a 24 y.o. male with medical history significant of ADHD as a child, migraines     Presented with   left knee pain and swelling Patient presents with left knee redness since Sunday was seen in urgent care started on Bactrim only to 1 day of that then [facility] was seen in the emergency department yesterday given a dose of Rocephin  IM Given a prescription of doxycycline  to add to the Keflex Patient seen by [facility] today redness and swelling increased and was sent back to emergency department for failure of outpatient antibiotics. Has had fever Plain images nonacute no foreign body or gas Patient states that he woke up Sunday night feeling pain in his left knee at first there was a small red spot despite going to urgent care and being started on antibiotics the pain and the redness has progressed.  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  No break in the skin noted no insect bites Patient never had any injury to that knee before. no history of gout  10/07/2024: Patient is on IV vancomycin  and cefepime .  Patient seen alongside patient's mother, further and girlfriend.  Cellulitis is improving.  No fever or chills.  Continue antibiotics for now.  10/08/2024: Patient seen.  Colitis continues to improve.  Likely discharge back home tomorrow on oral antibiotics.   Assessment & Plan:   Principal Problem:   Cellulitis Active Problems:   Cellulitis of knee, left   Cellulitis of knee, left - Continue IV vancomycin  and cefepime . -Cellulitis is slowly improving. - Follow cultures. Likely discharge back home tomorrow on oral  antibiotics.  DVT prophylaxis: Subcutaneous heparin  Code Status: Full code. Family Communication: Parents and girlfriend. Disposition Plan: Likely discharge back home eventually.   Consultants:  None.  Procedures:  None.  Antimicrobials:  IV vancomycin . IV cefepime .   Subjective: No new complaints.  Objective: Vitals:   10/07/24 1611 10/07/24 1952 10/08/24 0528 10/08/24 0749  BP: (!) 140/69 135/60 (!) 127/54 (!) 105/50  Pulse: 68 75 (!) 50 (!) 59  Resp:  17 17   Temp: 98.3 F (36.8 C) 98.6 F (37 C) 97.7 F (36.5 C) 97.8 F (36.6 C)  TempSrc:  Oral Oral   SpO2: 99% 98% 100% 100%  Weight:      Height:        Intake/Output Summary (Last 24 hours) at 10/08/2024 1433 Last data filed at 10/08/2024 0518 Gross per 24 hour  Intake 585.6 ml  Output --  Net 585.6 ml   Filed Weights   10/06/24 2053  Weight: 90.7 kg    Examination:  General exam: Appears calm and comfortable  Respiratory system: Clear to auscultation. Respiratory effort normal. Cardiovascular system: S1 & S2 heard. Gastrointestinal system: Abdomen is soft and nontender. \ Central nervous system: Awake and alert.. Extremities:  (Picture taken today 10/07/2024)    (Taken on 10/06/2024)  Data Reviewed: I have personally reviewed following labs and imaging studies  CBC: Recent Labs  Lab 10/04/24 2157 10/06/24 2113 10/07/24 0501  WBC 9.1 7.1 6.1  NEUTROABS 6.8 4.2  --   HGB 13.6 13.2 11.9*  HCT 40.9 39.7 38.3*  MCV 91.1 93.9 96.2  PLT 242 280 246   Basic Metabolic Panel: Recent Labs  Lab 10/04/24 2157 10/06/24 2113 10/07/24 0501  NA 136 140 142  K 3.9 4.0 3.8  CL 100 104 111  CO2 24 27 22   GLUCOSE 119* 110* 80  BUN 14 12 13   CREATININE 1.21 0.92 0.86  CALCIUM 9.8 9.5 7.8*  MG  --   --  1.9  PHOS  --   --  3.8   GFR: Estimated Creatinine Clearance: 158.3 mL/min (by C-G formula based on SCr of 0.86 mg/dL). Liver Function Tests: Recent Labs  Lab 10/04/24 2157  10/06/24 2113 10/07/24 0501  AST 23 22 16   ALT 38 27 18  ALKPHOS 68 64 50  BILITOT 0.4 0.3 <0.2  PROT 7.9 7.6 5.9*  ALBUMIN 4.5 4.2 3.3*   No results for input(s): LIPASE, AMYLASE in the last 168 hours. No results for input(s): AMMONIA in the last 168 hours. Coagulation Profile: No results for input(s): INR, PROTIME in the last 168 hours. Cardiac Enzymes: No results for input(s): CKTOTAL, CKMB, CKMBINDEX, TROPONINI in the last 168 hours. BNP (last 3 results) No results for input(s): PROBNP in the last 8760 hours. HbA1C: No results for input(s): HGBA1C in the last 72 hours. CBG: No results for input(s): GLUCAP in the last 168 hours. Lipid Profile: No results for input(s): CHOL, HDL, LDLCALC, TRIG, CHOLHDL, LDLDIRECT in the last 72 hours. Thyroid Function Tests: No results for input(s): TSH, T4TOTAL, FREET4, T3FREE, THYROIDAB in the last 72 hours. Anemia Panel: No results for input(s): VITAMINB12, FOLATE, FERRITIN, TIBC, IRON, RETICCTPCT in the last 72 hours. Urine analysis: No results found for: COLORURINE, APPEARANCEUR, LABSPEC, PHURINE, GLUCOSEU, HGBUR, BILIRUBINUR, KETONESUR, PROTEINUR, UROBILINOGEN, NITRITE, LEUKOCYTESUR Sepsis Labs: @LABRCNTIP (procalcitonin:4,lacticidven:4)  ) Recent Results (from the past 240 hours)  Blood culture (routine x 2)     Status: None (Preliminary result)   Collection Time: 10/06/24 11:10 PM   Specimen: BLOOD  Result Value Ref Range Status   Specimen Description   Final    BLOOD RIGHT ANTECUBITAL Performed at [facility], [address] [place], [place] [address]    Special Requests   Final    BOTTLES DRAWN AEROBIC AND ANAEROBIC Blood Culture adequate volume Performed at [facility], [address] [place], [place] [address]    Culture   Final    NO GROWTH 1 DAY Performed at [facility], [address] [place], [place] [address]    Report Status PENDING  Incomplete  Blood culture (routine x 2)     Status: None (Preliminary result)   Collection Time: 10/06/24 11:18 PM   Specimen: BLOOD RIGHT HAND  Result Value Ref Range Status   Specimen Description   Final    BLOOD RIGHT HAND Performed at [facility], [address], [place], [place] [address]    Special Requests   Final    BOTTLES DRAWN AEROBIC AND ANAEROBIC Blood Culture results may not be optimal due to an inadequate volume of blood received in culture bottles Performed at [facility], [address], [place], [place] [address]    Culture   Final    NO GROWTH 1 DAY Performed at [facility], [address], [place], [place] [address]    Report Status PENDING  Incomplete  MRSA Next Gen by PCR, Nasal     Status: None   Collection Time:  10/06/24 11:40 PM   Specimen: Nasal Mucosa; Nasal Swab  Result Value Ref Range Status   MRSA by PCR Next Gen NOT DETECTED NOT DETECTED Final    Comment: (NOTE) The GeneXpert MRSA Assay (FDA approved for NASAL specimens only), is one component of a comprehensive MRSA colonization surveillance program. It is not intended to diagnose MRSA infection nor to guide or monitor treatment for MRSA infections. Test performance is not FDA approved in patients less than 37 years old. Performed at [facility], [address] [place], [place] [address]          Radiology Studies: DG Knee Complete 4 Views Left Result Date: 10/06/2024 EXAM: 4 OR MORE VIEW(S) X-RAY OF THE LEFT KNEE 10/06/2024 09:21:00 PM COMPARISON: 10/04/24 CLINICAL HISTORY: Knee pain, effusion, prepatellar cellulitis FINDINGS: BONES AND JOINTS: No acute fracture. No malalignment. No significant joint effusion. SOFT TISSUES: Unremarkable. IMPRESSION: 1. No significant abnormality. Electronically signed by: Morgane Naveau MD 10/06/2024 09:23 PM EST RP Workstation: [id number]         Scheduled Meds:  heparin   5,000 Units Subcutaneous Q8H   Continuous Infusions:  ceFEPime  (MAXIPIME ) IV 2 g (10/08/24 0811)   vancomycin  1,250 mg (10/08/24 1428)     LOS: 2 days    Time spent: 35 minutes.    Leatrice Chapel, MD  [facility] 7PM-7AM contact night coverage as above    "

## 2024-10-08 NOTE — Plan of Care (Signed)
" °  Problem: Education: Goal: Knowledge of General Education information will improve Description: Including pain rating scale, medication(s)/side effects and non-pharmacologic comfort measures Outcome: Progressing   Problem: Clinical Measurements: Goal: Ability to maintain clinical measurements within normal limits will improve Outcome: Progressing   Problem: Activity: Goal: Risk for activity intolerance will decrease Outcome: Progressing   Problem: Nutrition: Goal: Adequate nutrition will be maintained Outcome: Progressing   Problem: Coping: Goal: Level of anxiety will decrease Outcome: Progressing   Problem: Elimination: Goal: Will not experience complications related to bowel motility Outcome: Progressing   Problem: Pain Managment: Goal: General experience of comfort will improve and/or be controlled Outcome: Progressing   Problem: Safety: Goal: Ability to remain free from injury will improve Outcome: Progressing   Problem: Skin Integrity: Goal: Risk for impaired skin integrity will decrease Outcome: Progressing   Problem: Clinical Measurements: Goal: Ability to avoid or minimize complications of infection will improve Outcome: Progressing   Problem: Skin Integrity: Goal: Skin integrity will improve Outcome: Progressing   "

## 2024-10-09 ENCOUNTER — Other Ambulatory Visit (HOSPITAL_COMMUNITY): Payer: Self-pay

## 2024-10-09 DIAGNOSIS — L03116 Cellulitis of left lower limb: Secondary | ICD-10-CM | POA: Diagnosis not present

## 2024-10-09 MED ORDER — LINEZOLID 600 MG PO TABS
600.0000 mg | ORAL_TABLET | Freq: Two times a day (BID) | ORAL | 0 refills | Status: AC
  Filled 2024-10-09 (×2): qty 19, 10d supply, fill #0

## 2024-10-09 MED ORDER — AMOXICILLIN-POT CLAVULANATE 875-125 MG PO TABS
1.0000 | ORAL_TABLET | Freq: Two times a day (BID) | ORAL | 0 refills | Status: DC
  Filled 2024-10-09: qty 20, 10d supply, fill #0

## 2024-10-09 MED ORDER — HYDROCODONE-ACETAMINOPHEN 5-325 MG PO TABS: 1.0000 | ORAL_TABLET | ORAL | 0 refills | Status: DC | PRN

## 2024-10-09 MED ORDER — AMOXICILLIN-POT CLAVULANATE 875-125 MG PO TABS: 1.0000 | ORAL_TABLET | Freq: Two times a day (BID) | ORAL | 0 refills | Status: DC

## 2024-10-09 MED ORDER — HYDROCODONE-ACETAMINOPHEN 5-325 MG PO TABS: 1.0000 | ORAL_TABLET | ORAL | 0 refills | Status: AC | PRN

## 2024-10-09 MED ORDER — AMOXICILLIN-POT CLAVULANATE 875-125 MG PO TABS
1.0000 | ORAL_TABLET | Freq: Two times a day (BID) | ORAL | 0 refills | Status: AC
  Filled 2024-10-09 (×2): qty 19, 10d supply, fill #0

## 2024-10-09 MED ORDER — LINEZOLID 600 MG PO TABS
600.0000 mg | ORAL_TABLET | Freq: Two times a day (BID) | ORAL | 0 refills | Status: DC
  Filled 2024-10-09: qty 20, 10d supply, fill #0

## 2024-10-09 MED ORDER — LINEZOLID 600 MG PO TABS: 600.0000 mg | ORAL_TABLET | Freq: Two times a day (BID) | ORAL | 0 refills | Status: DC

## 2024-10-09 NOTE — Plan of Care (Signed)

## 2024-10-09 NOTE — Discharge Summary (Signed)
 Physician Discharge Summary  Patient ID: Kenneth Christian MRN: [id number] DOB/AGE: 13-Feb-2001 23 y.o.  Admit date: 10/06/2024 Discharge date: 10/09/2024  Admission Diagnoses:  Discharge Diagnoses:  Principal Problem:   Cellulitis Active Problems:   Cellulitis of knee, left   Discharged Condition: {condition:18240}  Hospital Course: ***  Consults: {consultation:18241}  Significant Diagnostic Studies: {diagnostics:18242}  Treatments: {Tx:18249}  Discharge Exam: Blood pressure 125/60, pulse 65, temperature 97.8 F (36.6 C), resp. rate 20, height 6' 3 (1.905 m), weight 90.7 kg, SpO2 100%. {physical zkjf:6958869}  Disposition: Discharge disposition: 01-Home or Self Care       Discharge Instructions     Increase activity slowly   Complete by: As directed       Allergies as of 10/09/2024       Reactions   Zithromax [azithromycin Dihydrate] Hives        Medication List     STOP taking these medications    cephALEXin 500 MG capsule Commonly known as: KEFLEX   doxycycline  100 MG capsule Commonly known as: VIBRAMYCIN        TAKE these medications    amoxicillin -clavulanate 875-125 MG tablet Commonly known as: AUGMENTIN  Take 1 tablet by mouth 2 (two) times daily.   HYDROcodone -acetaminophen  5-325 MG tablet Commonly known as: NORCO/VICODIN Take 1-2 tablets by mouth every 4 (four) hours as needed for moderate pain (pain score 4-6).   linezolid  600 MG tablet Commonly known as: ZYVOX  Take 1 tablet (600 mg total) by mouth 2 (two) times daily.   SUMAtriptan 100 MG tablet Commonly known as: IMITREX Take 100 mg by mouth every 2 (two) hours as needed.   topiramate 100 MG tablet Commonly known as: TOPAMAX Take 100 mg by mouth daily as needed.         SignedBETHA Leatrice LILLETTE Christian 10/09/2024, 11:16 AM

## 2024-10-12 LAB — CULTURE, BLOOD (ROUTINE X 2)
Culture: NO GROWTH
Culture: NO GROWTH
Special Requests: ADEQUATE
